# Patient Record
Sex: Female | Born: 1965 | Hispanic: No | Marital: Married | Smoking: Never smoker
Health system: Southern US, Community
[De-identification: ages and names within clinical notes are randomized; demographics above are authoritative.]

## PROBLEM LIST (undated history)

## (undated) DIAGNOSIS — E119 Type 2 diabetes mellitus without complications: Secondary | ICD-10-CM

## (undated) DIAGNOSIS — E785 Hyperlipidemia, unspecified: Secondary | ICD-10-CM

## (undated) HISTORY — PX: WISDOM TOOTH EXTRACTION: SHX21

---

## 2005-04-30 ENCOUNTER — Encounter: Admission: RE | Admit: 2005-04-30 | Discharge: 2005-04-30 | Payer: Self-pay | Admitting: Specialist

## 2005-08-28 ENCOUNTER — Other Ambulatory Visit: Admission: RE | Admit: 2005-08-28 | Discharge: 2005-08-28 | Payer: Self-pay | Admitting: Family Medicine

## 2005-10-15 ENCOUNTER — Ambulatory Visit (HOSPITAL_COMMUNITY): Admission: RE | Admit: 2005-10-15 | Discharge: 2005-10-15 | Payer: Self-pay | Admitting: Family Medicine

## 2006-07-17 ENCOUNTER — Other Ambulatory Visit: Admission: RE | Admit: 2006-07-17 | Discharge: 2006-07-17 | Payer: Self-pay | Admitting: Family Medicine

## 2007-07-30 ENCOUNTER — Other Ambulatory Visit: Admission: RE | Admit: 2007-07-30 | Discharge: 2007-07-30 | Payer: Self-pay | Admitting: Family Medicine

## 2008-05-07 ENCOUNTER — Ambulatory Visit (HOSPITAL_COMMUNITY): Admission: RE | Admit: 2008-05-07 | Discharge: 2008-05-07 | Payer: Self-pay | Admitting: Family Medicine

## 2008-05-21 ENCOUNTER — Encounter: Admission: RE | Admit: 2008-05-21 | Discharge: 2008-05-21 | Payer: Self-pay | Admitting: Family Medicine

## 2008-08-04 ENCOUNTER — Other Ambulatory Visit: Admission: RE | Admit: 2008-08-04 | Discharge: 2008-08-04 | Payer: Self-pay | Admitting: Family Medicine

## 2011-01-26 ENCOUNTER — Other Ambulatory Visit (HOSPITAL_COMMUNITY)
Admission: RE | Admit: 2011-01-26 | Discharge: 2011-01-26 | Disposition: A | Discharge status: Home / Self Care | Payer: 59 | Source: Ambulatory Visit | Attending: Family Medicine | Admitting: Family Medicine

## 2011-01-26 ENCOUNTER — Other Ambulatory Visit: Payer: Self-pay | Admitting: Family Medicine

## 2011-01-26 ENCOUNTER — Other Ambulatory Visit (HOSPITAL_COMMUNITY): Payer: Self-pay | Admitting: Family Medicine

## 2011-01-26 DIAGNOSIS — Z1231 Encounter for screening mammogram for malignant neoplasm of breast: Secondary | ICD-10-CM

## 2011-01-26 DIAGNOSIS — Z124 Encounter for screening for malignant neoplasm of cervix: Secondary | ICD-10-CM | POA: Insufficient documentation

## 2011-02-08 ENCOUNTER — Ambulatory Visit (HOSPITAL_COMMUNITY)
Admission: RE | Admit: 2011-02-08 | Discharge: 2011-02-08 | Disposition: A | Discharge status: Home / Self Care | Payer: 59 | Source: Ambulatory Visit | Attending: Family Medicine | Admitting: Family Medicine

## 2011-02-08 DIAGNOSIS — Z1231 Encounter for screening mammogram for malignant neoplasm of breast: Secondary | ICD-10-CM | POA: Insufficient documentation

## 2014-06-14 ENCOUNTER — Other Ambulatory Visit (HOSPITAL_COMMUNITY)
Admission: RE | Admit: 2014-06-14 | Discharge: 2014-06-14 | Disposition: A | Discharge status: Home / Self Care | Payer: 59 | Source: Ambulatory Visit | Attending: Family Medicine | Admitting: Family Medicine

## 2014-06-14 ENCOUNTER — Other Ambulatory Visit: Payer: Self-pay | Admitting: Family Medicine

## 2014-06-14 DIAGNOSIS — Z124 Encounter for screening for malignant neoplasm of cervix: Secondary | ICD-10-CM | POA: Diagnosis present

## 2014-06-14 DIAGNOSIS — Z1151 Encounter for screening for human papillomavirus (HPV): Secondary | ICD-10-CM | POA: Diagnosis present

## 2014-06-15 LAB — CYTOLOGY - PAP

## 2015-06-14 ENCOUNTER — Other Ambulatory Visit (HOSPITAL_COMMUNITY): Payer: Self-pay | Admitting: Family Medicine

## 2015-06-14 DIAGNOSIS — Z1231 Encounter for screening mammogram for malignant neoplasm of breast: Secondary | ICD-10-CM

## 2015-06-20 ENCOUNTER — Ambulatory Visit (HOSPITAL_COMMUNITY)
Admission: RE | Admit: 2015-06-20 | Discharge: 2015-06-20 | Disposition: A | Discharge status: Home / Self Care | Payer: 59 | Source: Ambulatory Visit | Attending: Family Medicine | Admitting: Family Medicine

## 2015-06-20 DIAGNOSIS — Z1231 Encounter for screening mammogram for malignant neoplasm of breast: Secondary | ICD-10-CM | POA: Diagnosis not present

## 2016-06-26 ENCOUNTER — Other Ambulatory Visit: Payer: Self-pay | Admitting: Family Medicine

## 2016-06-26 DIAGNOSIS — R1011 Right upper quadrant pain: Secondary | ICD-10-CM

## 2016-07-06 ENCOUNTER — Ambulatory Visit
Admission: RE | Admit: 2016-07-06 | Discharge: 2016-07-06 | Disposition: A | Discharge status: Home / Self Care | Payer: 59 | Source: Ambulatory Visit | Attending: Family Medicine | Admitting: Family Medicine

## 2016-07-06 DIAGNOSIS — R1011 Right upper quadrant pain: Secondary | ICD-10-CM

## 2016-07-25 ENCOUNTER — Ambulatory Visit: Payer: Self-pay | Admitting: Surgery

## 2016-07-30 NOTE — Pre-Procedure Instructions (Signed)
Emma [name]  07/30/2016     Your procedure is scheduled on : Thursday August 02, 2016 at 9:55 AM.  Report to [facility] Admitting at 7:50 AM.  Call this number if you have problems the morning of surgery: [phone]    Remember:  Do not eat food or drink liquids after midnight.  Take these medicines the morning of surgery with A SIP OF WATER : NONE  Stop taking any vitamins, herbal medications/supplements, NSAIDs, Ibuprofen, Advil, Motrin, Aleve, etc today   Do NOT take any diabetic pills the morning of your surgery (NO Metformin/Glucophage)    How to Manage Your Diabetes Before and After Surgery  Why is it important to control my blood sugar before and after surgery? . Improving blood sugar levels before and after surgery helps healing and can limit problems. . A way of improving blood sugar control is eating a healthy diet by: o  Eating less sugar and carbohydrates o  Increasing activity/exercise o  Talking with your doctor about reaching your blood sugar goals . High blood sugars (greater than 180 mg/dL) can raise your risk of infections and slow your recovery, so you will need to focus on controlling your diabetes during the weeks before surgery. . Make sure that the doctor who takes care of your diabetes knows about your planned surgery including the date and location.  How do I manage my blood sugar before surgery? . Check your blood sugar at least 4 times a day, starting 2 days before surgery, to make sure that the level is not too high or low. o Check your blood sugar the morning of your surgery when you wake up and every 2 hours until you get to the Short Stay unit. . If your blood sugar is less than 70 mg/dL, you will need to treat for low blood sugar: o Do not take insulin. o Treat a low blood sugar (less than 70 mg/dL) with  cup of clear juice (cranberry or apple), 4 glucose tablets, OR glucose gel. o Recheck blood sugar in 15 minutes after  treatment (to make sure it is greater than 70 mg/dL). If your blood sugar is not greater than 70 mg/dL on recheck, call [phone] for further instructions. . Report your blood sugar to the short stay nurse when you get to Short Stay.  . If you are admitted to the hospital after surgery: o Your blood sugar will be checked by the staff and you will probably be given insulin after surgery (instead of oral diabetes medicines) to make sure you have good blood sugar levels. o The goal for blood sugar control after surgery is 80-180 mg/dL.      WHAT DO I DO ABOUT MY DIABETES MEDICATION?   Marland Kitchen Do not take oral diabetes medicines (pills) the morning of surgery.  Reviewed and Endorsed by [facility] Patient Education Committee, August 2015   Do not wear jewelry, make-up or nail polish.  Do not wear lotions, powders, or perfumes, or deoderant.  Do not shave 48 hours prior to surgery.   Do not bring valuables to the hospital.  [facility] is not responsible for any belongings or valuables.  Contacts, dentures or bridgework may not be worn into surgery.  Leave your suitcase in the car.  After surgery it may be brought to your room.  For patients admitted to the hospital, discharge time will be determined by your treatment team.  Patients discharged the day of surgery will not be allowed  to drive home.   Name and phone number of your driver:    Special instructions:  Shower using CHG soap the night before and the morning of your surgery  Please read over the following information sheets that you were given.

## 2016-07-31 ENCOUNTER — Encounter (HOSPITAL_COMMUNITY)
Admission: RE | Admit: 2016-07-31 | Discharge: 2016-07-31 | Disposition: A | Discharge status: Home / Self Care | Payer: 59 | Source: Ambulatory Visit | Attending: Surgery | Admitting: Surgery

## 2016-07-31 ENCOUNTER — Encounter (HOSPITAL_COMMUNITY): Payer: Self-pay

## 2016-07-31 DIAGNOSIS — E119 Type 2 diabetes mellitus without complications: Secondary | ICD-10-CM | POA: Diagnosis not present

## 2016-07-31 DIAGNOSIS — E7800 Pure hypercholesterolemia, unspecified: Secondary | ICD-10-CM | POA: Diagnosis not present

## 2016-07-31 DIAGNOSIS — K219 Gastro-esophageal reflux disease without esophagitis: Secondary | ICD-10-CM | POA: Diagnosis not present

## 2016-07-31 DIAGNOSIS — Z79899 Other long term (current) drug therapy: Secondary | ICD-10-CM | POA: Diagnosis not present

## 2016-07-31 DIAGNOSIS — Z7984 Long term (current) use of oral hypoglycemic drugs: Secondary | ICD-10-CM | POA: Diagnosis not present

## 2016-07-31 DIAGNOSIS — K8010 Calculus of gallbladder with chronic cholecystitis without obstruction: Secondary | ICD-10-CM | POA: Diagnosis present

## 2016-07-31 HISTORY — DX: Hyperlipidemia, unspecified: E78.5

## 2016-07-31 HISTORY — DX: Type 2 diabetes mellitus without complications: E11.9

## 2016-07-31 LAB — CBC
HEMATOCRIT: 42.4 % (ref 36.0–46.0)
Hemoglobin: 14.1 g/dL (ref 12.0–15.0)
MCH: 29.4 pg (ref 26.0–34.0)
MCHC: 33.3 g/dL (ref 30.0–36.0)
MCV: 88.3 fL (ref 78.0–100.0)
Platelets: 184 10*3/uL (ref 150–400)
RBC: 4.8 MIL/uL (ref 3.87–5.11)
RDW: 12.5 % (ref 11.5–15.5)
WBC: 7.4 10*3/uL (ref 4.0–10.5)

## 2016-07-31 LAB — BASIC METABOLIC PANEL
ANION GAP: 8 (ref 5–15)
BUN: 5 mg/dL — ABNORMAL LOW (ref 6–20)
CO2: 24 mmol/L (ref 22–32)
Calcium: 9.2 mg/dL (ref 8.9–10.3)
Chloride: 104 mmol/L (ref 101–111)
Creatinine, Ser: 0.88 mg/dL (ref 0.44–1.00)
GFR calc Af Amer: >60 mL/min (ref >60)
GFR calc non Af Amer: >60 mL/min (ref >60)
GLUCOSE: 251 mg/dL — AB (ref 65–99)
POTASSIUM: 3.8 mmol/L (ref 3.5–5.1)
Sodium: 136 mmol/L (ref 135–145)

## 2016-07-31 LAB — GLUCOSE, CAPILLARY: Glucose-Capillary: 206 mg/dL — ABNORMAL HIGH (ref 65–99)

## 2016-07-31 LAB — HEMOGLOBIN A1C
Hgb A1c MFr Bld: 6.5 % — ABNORMAL HIGH (ref 4.8–5.6)
MEAN PLASMA GLUCOSE: 140 mg/dL

## 2016-07-31 LAB — HCG, SERUM, QUALITATIVE: Preg, Serum: NEGATIVE

## 2016-07-31 MED ORDER — CHLORHEXIDINE GLUCONATE CLOTH 2 % EX PADS: 6.0000 | MEDICATED_PAD | Freq: Once | CUTANEOUS | Status: DC

## 2016-07-31 NOTE — Progress Notes (Signed)
EKG noted. Patient stated she has not had another EKG in the past, therefore there will not be another available for comparison.   Will send chart to Anesthesia for review.

## 2016-07-31 NOTE — Progress Notes (Signed)
PCP is Emma [name]  Patient denied having any cardiac or pulmonary issues  Patient does not check blood glucose at home. CBG on arrival to PAT was 206. Patient stated she consumed "cereal and a banana, and drank some black coffee".

## 2016-08-01 ENCOUNTER — Encounter (HOSPITAL_COMMUNITY): Payer: Self-pay | Admitting: Surgery

## 2016-08-01 DIAGNOSIS — K8010 Calculus of gallbladder with chronic cholecystitis without obstruction: Secondary | ICD-10-CM | POA: Diagnosis present

## 2016-08-01 MED ORDER — CEFAZOLIN SODIUM-DEXTROSE 2-4 GM/100ML-% IV SOLN
2.0000 g | INTRAVENOUS | Status: AC
  Administered 2016-08-02: 2 g via INTRAVENOUS
  Filled 2016-08-01: qty 100

## 2016-08-01 NOTE — H&P (Signed)
General Surgery [facility]  Emma [name] DOB: Mar 09, 1966 Married / Language: English / Race: Undefined Female  History of Present Illness  Patient words: gallbladder.  The patient is a 50 year old female who presents for evaluation of gall stones.  Patient is referred by Dr. Shirlean Mylar for surgical management of symptomatic cholelithiasis and chronic cholecystitis. Patient presents with approximately a 2 month history of heartburn symptoms and right upper quadrant abdominal pain. Patient was placed on antacid medication. Ultrasound was performed on July 06, 2016. This shows a gallbladder filled with small stones. There is sludge present. There is a large stone measuring 3 cm in the neck of the gallbladder. There is no sign of infection. Patient has had no prior abdominal surgery. She denies fevers or chills. She has no prior history of a hepatic biliary or pancreatic disease. There is a family history of cholecystectomy in both a brother and a sister. Patient presents today to discuss cholecystectomy.  Other Problems Diabetes Mellitus Gastroesophageal Reflux Disease Hypercholesterolemia  Past Surgical History No pertinent past surgical history  Diagnostic Studies History Colonoscopy never  Allergies No Known Allergies09/13/2017  Medication History MetFORMIN HCl ER (500MG  Tablet ER 24HR, Oral) Active. Pravastatin Sodium (20MG  Tablet, Oral) Active. RaNITidine HCl (150MG  Tablet, Oral) Active.  Social History Alcohol use Occasional alcohol use. Caffeine use Coffee. No drug use Tobacco use Never smoker.  Family History Diabetes Mellitus Brother, Mother, Sister. Heart Disease Mother. Heart disease in female family member before age 49 Hypertension Mother.  Review of Systems General Not Present- Appetite Loss, Chills, Fatigue, Fever, Night Sweats, Weight Gain and Weight Loss. Skin Not Present- Change in Wart/Mole, Dryness,  Hives, Jaundice, New Lesions, Non-Healing Wounds, Rash and Ulcer. HEENT Present- Wears glasses/contact lenses. Not Present- Earache, Hearing Loss, Hoarseness, Nose Bleed, Oral Ulcers, Ringing in the Ears, Seasonal Allergies, Sinus Pain, Sore Throat, Visual Disturbances and Yellow Eyes. Respiratory Present- Snoring. Not Present- Bloody sputum, Chronic Cough, Difficulty Breathing and Wheezing. Breast Not Present- Breast Mass, Breast Pain, Nipple Discharge and Skin Changes. Cardiovascular Not Present- Chest Pain, Difficulty Breathing Lying Down, Leg Cramps, Palpitations, Rapid Heart Rate, Shortness of Breath and Swelling of Extremities. Gastrointestinal Not Present- Abdominal Pain, Bloating, Bloody Stool, Change in Bowel Habits, Chronic diarrhea, Constipation, Difficulty Swallowing, Excessive gas, Gets full quickly at meals, Hemorrhoids, Indigestion, Nausea, Rectal Pain and Vomiting. Musculoskeletal Not Present- Back Pain, Joint Pain, Joint Stiffness, Muscle Pain, Muscle Weakness and Swelling of Extremities. Neurological Not Present- Decreased Memory, Fainting, Headaches, Numbness, Seizures, Tingling, Tremor, Trouble walking and Weakness. Psychiatric Not Present- Anxiety, Bipolar, Change in Sleep Pattern, Depression, Fearful and Frequent crying. Endocrine Not Present- Cold Intolerance, Excessive Hunger, Hair Changes, Heat Intolerance, Hot flashes and New Diabetes. Hematology Not Present- Blood Thinners, Easy Bruising, Excessive bleeding, Gland problems, HIV and Persistent Infections.  Vitals Weight: 121.6 lb Height: 60.5in Body Surface Area: 1.52 m Body Mass Index: 23.36 kg/m  Temp.: 98.21F(Oral)  Pulse: 60 (Regular)  BP: 102/64 (Sitting, Left Arm, Standard)  Physical Exam The physical exam findings are as follows: Note:General - appears comfortable, no distress; not diaphorectic  HEENT - normocephalic; sclerae clear, gaze conjugate; mucous membranes moist, dentition good; voice  normal  Neck - symmetric on extension; no palpable anterior or posterior cervical adenopathy; no palpable masses in the thyroid bed  Chest - clear bilaterally without rhonchi, rales, or wheeze  Cor - regular rhythm with normal rate; no significant murmur  Abd - soft without distension; no hernias; no surgical incision; palpation  in the upper abdomen shows no tenderness and no mass  Ext - non-tender without significant edema or lymphedema  Neuro - grossly intact; no tremor  Assessment & Plan  CALCULUS OF GALLBLADDER WITH CHRONIC CHOLECYSTITIS WITHOUT OBSTRUCTION (K80.10)  Patient presents on referral for consideration for cholecystectomy. Patient is provided with written literature on gallbladder surgery to review at home.  Patient has significant cholelithiasis which is symptomatic. I have recommended proceeding with laparoscopic cholecystectomy with intraoperative cholangiography. We have discussed the risks and benefits of the procedure. We have discussed the hospital stay to be anticipated. We have discussed her postoperative recovery and restrictions on her diet. She understands and wishes to proceed with surgery in the near future.  The risks and benefits of the procedure have been discussed at length with the patient. The patient understands the proposed procedure, potential alternative treatments, and the course of recovery to be expected. All of the patient's questions have been answered at this time. The patient wishes to proceed with surgery.  Velora Hecklerodd M. Araeya Lamb, MD, [facility] Office: [phone]

## 2016-08-01 NOTE — Progress Notes (Signed)
Anesthesia Chart Review: Patient is a 50 year old female scheduled for laparoscopic cholecystectomy on 08/02/2016 by Dr. Gerrit FriendsGerkin.  History includes non-smoker, DM2, HLD, wisdom teeth extraction.  PCP is Dr. Shirlean Mylararol Webb.  Meds include metformin, pravastatin.  BP 111/68   Pulse (!) 108   Temp 36.7 C   Resp 20   Ht 5\' 1"  (1.549 m)   Wt 119 lb 6.4 oz (54.2 kg)   LMP 07/16/2016   SpO2 100%   BMI 22.56 kg/m   07/31/16 EKG: SR, low voltage QRS, non-specific T wave abnormality.  Preoperative labs noted. Non-fasting glucose 251, but A1c 6.5. CBC WNL. Cr 0.88. Negative serum pregnancy test.  If no acute changes then I anticipate that she can proceed as planned.  Velna Ochsllison Hayly Litsey, PA-C [facility] Short [facility]/Anesthesiology Phone [phone] 08/01/2016 9:31 AM

## 2016-08-02 ENCOUNTER — Ambulatory Visit (HOSPITAL_COMMUNITY): Payer: 59 | Admitting: Vascular Surgery

## 2016-08-02 ENCOUNTER — Encounter (HOSPITAL_COMMUNITY)
Admission: RE | Disposition: A | Discharge status: Home / Self Care | Payer: Self-pay | Source: Ambulatory Visit | Attending: Surgery

## 2016-08-02 ENCOUNTER — Observation Stay (HOSPITAL_COMMUNITY)
Admission: RE | Admit: 2016-08-02 | Discharge: 2016-08-02 | Disposition: A | Discharge status: Home / Self Care | Payer: 59 | Source: Ambulatory Visit | Attending: Surgery | Admitting: Surgery

## 2016-08-02 ENCOUNTER — Encounter (HOSPITAL_COMMUNITY): Payer: Self-pay | Admitting: Anesthesiology

## 2016-08-02 ENCOUNTER — Ambulatory Visit (HOSPITAL_COMMUNITY): Payer: 59 | Admitting: Anesthesiology

## 2016-08-02 DIAGNOSIS — K8010 Calculus of gallbladder with chronic cholecystitis without obstruction: Secondary | ICD-10-CM | POA: Diagnosis not present

## 2016-08-02 DIAGNOSIS — E119 Type 2 diabetes mellitus without complications: Secondary | ICD-10-CM | POA: Insufficient documentation

## 2016-08-02 DIAGNOSIS — K219 Gastro-esophageal reflux disease without esophagitis: Secondary | ICD-10-CM | POA: Insufficient documentation

## 2016-08-02 DIAGNOSIS — E7800 Pure hypercholesterolemia, unspecified: Secondary | ICD-10-CM | POA: Insufficient documentation

## 2016-08-02 DIAGNOSIS — Z7984 Long term (current) use of oral hypoglycemic drugs: Secondary | ICD-10-CM | POA: Insufficient documentation

## 2016-08-02 DIAGNOSIS — Z79899 Other long term (current) drug therapy: Secondary | ICD-10-CM | POA: Insufficient documentation

## 2016-08-02 HISTORY — PX: CHOLECYSTECTOMY: SHX55

## 2016-08-02 LAB — GLUCOSE, CAPILLARY
GLUCOSE-CAPILLARY: 142 mg/dL — AB (ref 65–99)
GLUCOSE-CAPILLARY: 156 mg/dL — AB (ref 65–99)

## 2016-08-02 SURGERY — LAPAROSCOPIC CHOLECYSTECTOMY WITH INTRAOPERATIVE CHOLANGIOGRAM
Anesthesia: General | Site: Abdomen

## 2016-08-02 MED ORDER — METFORMIN HCL ER 500 MG PO TB24: 500.0000 mg | ORAL_TABLET | Freq: Every day | ORAL | Status: DC

## 2016-08-02 MED ORDER — HYDROCODONE-ACETAMINOPHEN 5-325 MG PO TABS: 1.0000 | ORAL_TABLET | ORAL | 0 refills | Status: AC | PRN

## 2016-08-02 MED ORDER — PHENYLEPHRINE 40 MCG/ML (10ML) SYRINGE FOR IV PUSH (FOR BLOOD PRESSURE SUPPORT)
PREFILLED_SYRINGE | INTRAVENOUS | Status: AC
  Filled 2016-08-02: qty 10

## 2016-08-02 MED ORDER — MIDAZOLAM HCL 2 MG/2ML IJ SOLN
INTRAMUSCULAR | Status: AC
  Filled 2016-08-02: qty 2

## 2016-08-02 MED ORDER — HYDROCODONE-ACETAMINOPHEN 5-325 MG PO TABS
1.0000 | ORAL_TABLET | ORAL | Status: DC | PRN
Start: 2016-08-02 — End: 2016-08-02

## 2016-08-02 MED ORDER — HYDROMORPHONE HCL 1 MG/ML IJ SOLN
INTRAMUSCULAR | Status: AC
  Filled 2016-08-02: qty 1

## 2016-08-02 MED ORDER — PROPOFOL 10 MG/ML IV BOLUS
INTRAVENOUS | Status: DC | PRN
  Administered 2016-08-02: 160 mg via INTRAVENOUS

## 2016-08-02 MED ORDER — BUPIVACAINE-EPINEPHRINE 0.25% -1:200000 IJ SOLN
INTRAMUSCULAR | Status: DC | PRN
  Administered 2016-08-02: 30 mL

## 2016-08-02 MED ORDER — ONDANSETRON 4 MG PO TBDP: 4.0000 mg | ORAL_TABLET | Freq: Four times a day (QID) | ORAL | Status: DC | PRN

## 2016-08-02 MED ORDER — PROMETHAZINE HCL 25 MG/ML IJ SOLN: 6.2500 mg | INTRAMUSCULAR | Status: DC | PRN

## 2016-08-02 MED ORDER — ONDANSETRON HCL 4 MG/2ML IJ SOLN
INTRAMUSCULAR | Status: DC | PRN
  Administered 2016-08-02: 4 mg via INTRAVENOUS

## 2016-08-02 MED ORDER — LIDOCAINE 2% (20 MG/ML) 5 ML SYRINGE
INTRAMUSCULAR | Status: AC
  Filled 2016-08-02: qty 5

## 2016-08-02 MED ORDER — HYDROMORPHONE HCL 1 MG/ML IJ SOLN
0.2500 mg | INTRAMUSCULAR | Status: DC | PRN
  Administered 2016-08-02: 0.25 mg via INTRAVENOUS

## 2016-08-02 MED ORDER — IOPAMIDOL (ISOVUE-300) INJECTION 61%
INTRAVENOUS | Status: DC | PRN
  Administered 2016-08-02: 100 mL

## 2016-08-02 MED ORDER — LACTATED RINGERS IV SOLN
INTRAVENOUS | Status: DC
  Administered 2016-08-02 (×2): via INTRAVENOUS

## 2016-08-02 MED ORDER — BUPIVACAINE-EPINEPHRINE (PF) 0.25% -1:200000 IJ SOLN
INTRAMUSCULAR | Status: AC
  Filled 2016-08-02: qty 30

## 2016-08-02 MED ORDER — IOPAMIDOL (ISOVUE-300) INJECTION 61%
INTRAVENOUS | Status: AC
  Filled 2016-08-02: qty 50

## 2016-08-02 MED ORDER — FENTANYL CITRATE (PF) 100 MCG/2ML IJ SOLN: 25.0000 ug | INTRAMUSCULAR | Status: DC | PRN

## 2016-08-02 MED ORDER — ACETAMINOPHEN 650 MG RE SUPP: 650.0000 mg | Freq: Four times a day (QID) | RECTAL | Status: DC | PRN

## 2016-08-02 MED ORDER — ROCURONIUM BROMIDE 10 MG/ML (PF) SYRINGE
PREFILLED_SYRINGE | INTRAVENOUS | Status: AC
  Filled 2016-08-02: qty 10

## 2016-08-02 MED ORDER — ONDANSETRON HCL 4 MG/2ML IJ SOLN
INTRAMUSCULAR | Status: AC
  Filled 2016-08-02: qty 2

## 2016-08-02 MED ORDER — KCL IN DEXTROSE-NACL 20-5-0.45 MEQ/L-%-% IV SOLN: INTRAVENOUS | Status: DC

## 2016-08-02 MED ORDER — 0.9 % SODIUM CHLORIDE (POUR BTL) OPTIME
TOPICAL | Status: DC | PRN
  Administered 2016-08-02: 1000 mL

## 2016-08-02 MED ORDER — LIDOCAINE HCL (CARDIAC) 20 MG/ML IV SOLN
INTRAVENOUS | Status: DC | PRN
  Administered 2016-08-02: 80 mg via INTRAVENOUS

## 2016-08-02 MED ORDER — SODIUM CHLORIDE 0.9 % IR SOLN
Status: DC | PRN
  Administered 2016-08-02: 1000 mL

## 2016-08-02 MED ORDER — SUGAMMADEX SODIUM 200 MG/2ML IV SOLN
INTRAVENOUS | Status: DC | PRN
  Administered 2016-08-02: 200 mg via INTRAVENOUS

## 2016-08-02 MED ORDER — SUGAMMADEX SODIUM 200 MG/2ML IV SOLN
INTRAVENOUS | Status: AC
  Filled 2016-08-02: qty 2

## 2016-08-02 MED ORDER — FENTANYL CITRATE (PF) 100 MCG/2ML IJ SOLN
INTRAMUSCULAR | Status: DC | PRN
  Administered 2016-08-02: 100 ug via INTRAVENOUS

## 2016-08-02 MED ORDER — HYDROMORPHONE HCL 1 MG/ML IJ SOLN: 1.0000 mg | INTRAMUSCULAR | Status: DC | PRN

## 2016-08-02 MED ORDER — PRAVASTATIN SODIUM 10 MG PO TABS: 20.0000 mg | ORAL_TABLET | Freq: Every day | ORAL | Status: DC

## 2016-08-02 MED ORDER — FENTANYL CITRATE (PF) 100 MCG/2ML IJ SOLN
INTRAMUSCULAR | Status: AC
  Filled 2016-08-02: qty 4

## 2016-08-02 MED ORDER — MIDAZOLAM HCL 5 MG/5ML IJ SOLN
INTRAMUSCULAR | Status: DC | PRN
  Administered 2016-08-02: 2 mg via INTRAVENOUS

## 2016-08-02 MED ORDER — DEXAMETHASONE SODIUM PHOSPHATE 10 MG/ML IJ SOLN
INTRAMUSCULAR | Status: AC
  Filled 2016-08-02: qty 1

## 2016-08-02 MED ORDER — ONDANSETRON HCL 4 MG/2ML IJ SOLN: 4.0000 mg | Freq: Four times a day (QID) | INTRAMUSCULAR | Status: DC | PRN

## 2016-08-02 MED ORDER — PHENYLEPHRINE HCL 10 MG/ML IJ SOLN
INTRAMUSCULAR | Status: DC | PRN
  Administered 2016-08-02 (×4): 40 ug via INTRAVENOUS

## 2016-08-02 MED ORDER — DEXAMETHASONE SODIUM PHOSPHATE 4 MG/ML IJ SOLN
INTRAMUSCULAR | Status: DC | PRN
  Administered 2016-08-02: 4 mg via INTRAVENOUS

## 2016-08-02 MED ORDER — PROPOFOL 10 MG/ML IV BOLUS
INTRAVENOUS | Status: AC
  Filled 2016-08-02: qty 20

## 2016-08-02 MED ORDER — ACETAMINOPHEN 325 MG PO TABS: 650.0000 mg | ORAL_TABLET | Freq: Four times a day (QID) | ORAL | Status: DC | PRN

## 2016-08-02 MED ORDER — ROCURONIUM BROMIDE 100 MG/10ML IV SOLN
INTRAVENOUS | Status: DC | PRN
  Administered 2016-08-02: 40 mg via INTRAVENOUS

## 2016-08-02 SURGICAL SUPPLY — 52 items
APPLIER CLIP ROT 10 11.4 M/L (STAPLE) ×3
BENZOIN TINCTURE PRP APPL 2/3 (GAUZE/BANDAGES/DRESSINGS) ×3 IMPLANT
BLADE SURG CLIPPER 3M 9600 (MISCELLANEOUS) IMPLANT
CANISTER SUCTION 2500CC (MISCELLANEOUS) ×3 IMPLANT
CHLORAPREP W/TINT 26ML (MISCELLANEOUS) ×3 IMPLANT
CLIP APPLIE ROT 10 11.4 M/L (STAPLE) ×1 IMPLANT
CLOSURE WOUND 1/2 X4 (GAUZE/BANDAGES/DRESSINGS) ×1
COVER MAYO STAND STRL (DRAPES) ×3 IMPLANT
COVER SURGICAL LIGHT HANDLE (MISCELLANEOUS) ×3 IMPLANT
DRAPE C-ARM 42X72 X-RAY (DRAPES) ×3 IMPLANT
DRAPE UTILITY XL STRL (DRAPES) ×3 IMPLANT
ELECT REM PT RETURN 9FT ADLT (ELECTROSURGICAL) ×3
ELECTRODE REM PT RTRN 9FT ADLT (ELECTROSURGICAL) ×1 IMPLANT
GAUZE SPONGE 2X2 8PLY STRL LF (GAUZE/BANDAGES/DRESSINGS) ×1 IMPLANT
GAUZE SPONGE 4X4 16PLY XRAY LF (GAUZE/BANDAGES/DRESSINGS) ×3 IMPLANT
GLOVE BIO SURGEON STRL SZ7.5 (GLOVE) ×3 IMPLANT
GLOVE BIOGEL PI IND STRL 6.5 (GLOVE) ×1 IMPLANT
GLOVE BIOGEL PI IND STRL 7.5 (GLOVE) ×2 IMPLANT
GLOVE BIOGEL PI IND STRL 8 (GLOVE) ×1 IMPLANT
GLOVE BIOGEL PI INDICATOR 6.5 (GLOVE) ×2
GLOVE BIOGEL PI INDICATOR 7.5 (GLOVE) ×4
GLOVE BIOGEL PI INDICATOR 8 (GLOVE) ×2
GLOVE ECLIPSE 7.5 STRL STRAW (GLOVE) ×3 IMPLANT
GLOVE SURG ORTHO 8.0 STRL STRW (GLOVE) ×3 IMPLANT
GLOVE SURG SS PI 6.5 STRL IVOR (GLOVE) ×3 IMPLANT
GOWN STRL REUS W/ TWL LRG LVL3 (GOWN DISPOSABLE) ×3 IMPLANT
GOWN STRL REUS W/ TWL XL LVL3 (GOWN DISPOSABLE) ×1 IMPLANT
GOWN STRL REUS W/TWL LRG LVL3 (GOWN DISPOSABLE) ×6
GOWN STRL REUS W/TWL XL LVL3 (GOWN DISPOSABLE) ×2
KIT BASIN OR (CUSTOM PROCEDURE TRAY) ×3 IMPLANT
KIT ROOM TURNOVER OR (KITS) ×3 IMPLANT
NS IRRIG 1000ML POUR BTL (IV SOLUTION) ×3 IMPLANT
PAD ARMBOARD 7.5X6 YLW CONV (MISCELLANEOUS) ×3 IMPLANT
POUCH SPECIMEN RETRIEVAL 10MM (ENDOMECHANICALS) ×3 IMPLANT
SCISSORS LAP 5X35 DISP (ENDOMECHANICALS) ×3 IMPLANT
SET CHOLANGIOGRAPH 5 50 .035 (SET/KITS/TRAYS/PACK) ×3 IMPLANT
SET IRRIG TUBING LAPAROSCOPIC (IRRIGATION / IRRIGATOR) ×3 IMPLANT
SLEEVE ENDOPATH XCEL 5M (ENDOMECHANICALS) ×3 IMPLANT
SPECIMEN JAR SMALL (MISCELLANEOUS) ×3 IMPLANT
SPONGE GAUZE 2X2 STER 10/PKG (GAUZE/BANDAGES/DRESSINGS) ×2
STRIP CLOSURE SKIN 1/2X4 (GAUZE/BANDAGES/DRESSINGS) ×2 IMPLANT
SUT MNCRL AB 4-0 PS2 18 (SUTURE) ×3 IMPLANT
SYR CONTROL 10ML LL (SYRINGE) ×3 IMPLANT
SYRINGE 20CC LL (MISCELLANEOUS) ×6 IMPLANT
TAPE CLOTH SURG 4X10 WHT LF (GAUZE/BANDAGES/DRESSINGS) ×3 IMPLANT
TOWEL OR 17X24 6PK STRL BLUE (TOWEL DISPOSABLE) ×3 IMPLANT
TOWEL OR 17X26 10 PK STRL BLUE (TOWEL DISPOSABLE) IMPLANT
TRAY LAPAROSCOPIC MC (CUSTOM PROCEDURE TRAY) ×3 IMPLANT
TROCAR XCEL BLUNT TIP 100MML (ENDOMECHANICALS) ×3 IMPLANT
TROCAR XCEL NON-BLD 11X100MML (ENDOMECHANICALS) ×3 IMPLANT
TROCAR XCEL NON-BLD 5MMX100MML (ENDOMECHANICALS) ×3 IMPLANT
TUBING INSUFFLATION (TUBING) ×3 IMPLANT

## 2016-08-02 NOTE — Discharge Instructions (Signed)
°  [facility] ° °LAPAROSCOPIC SURGERY:  POST-OP INSTRUCTIONS ° °Always review your discharge instruction sheet given to you by the facility where your surgery was performed. ° °A prescription for pain medication may be given to you upon discharge.  Take your pain medication as prescribed.  If narcotic pain medicine is not needed, then you may take acetaminophen (Tylenol) or ibuprofen (Advil) as needed. ° °Take your usually prescribed medications unless otherwise directed. ° °If you need a refill on your pain medication, please contact your pharmacy.  They will contact our office to request authorization. Prescriptions will not be filled after 5 P.M. or on weekends. ° °You should follow a light diet the first few days after arrival home, such as soup and crackers or toast.  Be sure to include plenty of fluids daily. ° °Most patients will experience some swelling and bruising in the area of the incisions.  Ice packs will help.  Swelling and bruising can take several days to resolve.  ° °It is common to experience some constipation after surgery.  Increasing fluid intake and taking a stool softener (such as Colace) will usually help or prevent this problem from occurring.  A mild laxative (Milk of Magnesia or Miralax) should be taken according to package instructions if there has been no bowel movement after 48 hours. ° °You will have steri-strips and a gauze dressing over your incisions.  You may remove the gauze bandage on the second day after surgery, and you may shower at that time.  Leave your steri-strips (small skin tapes) in place directly over the incision.  These strips should remain on the skin for 5-7 days and then be removed.  You may get them wet in the shower and pat them dry. ° °Any sutures or staples will be removed at the office during your follow-up visit. ° °ACTIVITIES:  You may resume regular (light) daily activities beginning the next day - such as daily self-care, walking,  climbing stairs - gradually increasing activities as tolerated.  You may have sexual intercourse when it is comfortable.  Refrain from any heavy lifting or straining until approved by your doctor. ° °You may drive when you are no longer taking prescription pain medication, you can comfortably wear a seatbelt, and you can safely maneuver your car and apply brakes. ° °You should see your doctor in the office for a follow-up appointment approximately 2-3 weeks after your surgery.  Make sure that you call for this appointment within a day or two after you arrive home to insure a convenient appointment time. ° °WHEN TO CALL YOUR DOCTOR: °1. Fever over 101.0 °2. Inability to urinate °3. Continued bleeding from incision °4. Increased pain, redness, or drainage from the incision °5. Increasing abdominal pain ° °The clinic staff is available to answer your questions during regular business hours.  Please don’t hesitate to call and ask to speak to one of the nurses for clinical concerns.  If you have a medical emergency, go to the nearest emergency room or call 911.  A surgeon from [facility] is always on call for the hospital. ° °Marijane Trower M. Herbert Marken, MD, FACS °[facility] °Office: [phone] °Toll Free:  [phone] °FAX [phone] ° °Website: [email or web address] °

## 2016-08-02 NOTE — Anesthesia Postprocedure Evaluation (Signed)
Anesthesia Post Note  Patient: Emma [name]  Procedure(s) Performed: Procedure(s) (LRB): LAPAROSCOPIC CHOLECYSTECTOMY WITH ATTEMPTED CHOLANGIOGRAM (N/A)  Patient location during evaluation: PACU Anesthesia Type: General Level of consciousness: awake Pain management: pain level controlled Vital Signs Assessment: post-procedure vital signs reviewed and stable Respiratory status: spontaneous breathing Cardiovascular status: stable Anesthetic complications: no    Last Vitals:  Vitals:   08/02/16 0805 08/02/16 1057  BP: 102/69 114/73  Pulse: 79   Resp: 20   Temp: 36.6 C 36.4 C    Last Pain:  Vitals:   08/02/16 0805  TempSrc: Oral                 EDWARDS,Azure Barrales

## 2016-08-02 NOTE — Transfer of Care (Signed)
Immediate Anesthesia Transfer of Care Note  Patient: Emma [name]  Procedure(s) Performed: Procedure(s): LAPAROSCOPIC CHOLECYSTECTOMY WITH ATTEMPTED CHOLANGIOGRAM (N/A)  Patient Location: PACU  Anesthesia Type:General  Level of Consciousness: awake, oriented and patient cooperative  Airway & Oxygen Therapy: Patient Spontanous Breathing and Patient connected to face mask oxygen  Post-op Assessment: Report given to RN and Post -op Vital signs reviewed and stable  Post vital signs: Reviewed  Last Vitals:  Vitals:   08/02/16 0805  BP: 102/69  Pulse: 79  Resp: 20  Temp: 36.6 C    Last Pain:  Vitals:   08/02/16 0805  TempSrc: Oral      Patients Stated Pain Goal: 4 (08/02/16 0810)  Complications: No apparent anesthesia complications

## 2016-08-02 NOTE — Progress Notes (Signed)
Pt discharged home explained prescription meds and home instructions and next appt, no IV line, alert and oriented escorted by the family and accompanied by Nurse tech via wheelchair, no s/s of distress noted, all personal belongings given.

## 2016-08-02 NOTE — Op Note (Signed)
Procedure Note  Pre-operative Diagnosis:  Chronic cholecystitis, cholelithiasis  Post-operative Diagnosis:  same  Surgeon:  Velora Hecklerodd M. Jamara Vary, MD, FACS  Assistant:  Magnus IvanSheila Bowman, RNFA   Procedure:  Laparoscopic cholecystectomy  Anesthesia:  General  Estimated Blood Loss:  minimal  Drains: none         Specimen: Gallbladder to pathology  Indications:  The patient is a 50 year old female who presents for evaluation of gall stones. Patient is referred by Dr. Shirlean Mylararol Webb for surgical management of symptomatic cholelithiasis and chronic cholecystitis. Patient presents with approximately a 2 month history of heartburn symptoms and right upper quadrant abdominal pain. Patient was placed on antacid medication. Ultrasound was performed on July 06, 2016. This shows a gallbladder filled with small stones. There is sludge present. There is a large stone measuring 3 cm in the neck of the gallbladder. Patient now comes to surgery for cholecystectomy.  Procedure Details:  The patient was seen in the pre-op holding area. The risks, benefits, complications, treatment options, and expected outcomes were previously discussed with the patient. The patient agreed with the proposed plan and has signed the informed consent form.  The patient was brought to the Operating Room, identified as Ronnette HilaUma Ritchie and the procedure verified as laparoscopic cholecystectomy with intraoperative cholangiography. A "time out" was completed and the above information confirmed.  The patient was placed in the supine position. Following induction of general anesthesia, the abdomen was prepped and draped in the usual aseptic fashion.  An incision was made in the skin near the umbilicus. The midline fascia was incised and the peritoneal cavity was entered and a Hasson canula was introduced under direct vision.  The Hasson canula was secured with a 0-Vicryl pursestring suture. Pneumoperitoneum was established with carbon  dioxide. Additional trocars were introduced under direct vision along the right costal margin in the midline, mid-clavicular line, and anterior axillary line.   The gallbladder was identified and the fundus grasped and retracted cephalad. Adhesions were taken down bluntly and the electrocautery was utilized as needed, taking care not to injure any adjacent structures. The infundibulum was grasped and retracted laterally, exposing the peritoneum overlying the triangle of Calot. The peritoneum was incised and structures exposed with blunt dissection. The cystic duct was clearly identified, bluntly dissected circumferentially, and clipped at the neck of the gallbladder.  An incision was made in the cystic duct and an attepmpt to place a cholangiogram catheter into  the cystic duct was made.  Catheter was unable to be advanced and secured, and a decision not to perform cholangiogram was made.  The cystic duct was ligated with surgical clips and divided. The cystic artery was identified, dissected circumferentially, ligated with ligaclips, and divided.  The gallbladder was dissected away from the liver bed using the electrocautery for hemostasis. The gallbladder was completely removed from the liver and placed into an endocatch bag. The gallbladder was removed in the endocatch bag through the umbilical port site and submitted to pathology for review.  The right upper quadrant was irrigated and the gallbladder bed was inspected. Hemostasis was achieved with the electrocautery.  Pneumoperitoneum was released after viewing removal of the trocars with good hemostasis noted. The umbilical wound was irrigated and the fascia was then closed with the pursestring suture.  Local anesthetic was infiltrated at all port sites. The skin incisions were closed with 4-0 Monocril subcuticular sutures and steri-strips and dressings were applied.  Instrument, sponge, and needle counts were correct at the conclusion of the  case.   The patient was awakened from anesthesia and brought to the recovery room in stable condition.  The patient tolerated the procedure well.   Velora Heckler, MD, [facility] Office: [phone]

## 2016-08-02 NOTE — Anesthesia Preprocedure Evaluation (Addendum)
Anesthesia Evaluation  Patient identified by MRN, date of birth, ID band Patient awake    Reviewed: Allergy & Precautions, H&P , NPO status , Patient's Chart, lab work & pertinent test results  History of Anesthesia Complications Negative for: history of anesthetic complications  Airway Mallampati: II  TM Distance: >3 FB Neck ROM: full    Dental no notable dental hx. (+) Dental Advisory Given, Teeth Intact   Pulmonary neg pulmonary ROS,    Pulmonary exam normal breath sounds clear to auscultation       Cardiovascular negative cardio ROS Normal cardiovascular exam Rhythm:regular Rate:Normal     Neuro/Psych negative neurological ROS     GI/Hepatic negative GI ROS, Neg liver ROS,   Endo/Other  diabetes, Well Controlled, Type 2, Oral Hypoglycemic Agents  Renal/GU negative Renal ROS     Musculoskeletal   Abdominal   Peds  Hematology negative hematology ROS (+)   Anesthesia Other Findings   Reproductive/Obstetrics negative OB ROS                            Anesthesia Physical Anesthesia Plan  ASA: II  Anesthesia Plan: General   Post-op Pain Management:    Induction: Intravenous  Airway Management Planned: Oral ETT  Additional Equipment: None  Intra-op Plan:   Post-operative Plan: Extubation in OR  Informed Consent: I have reviewed the patients History and Physical, chart, labs and discussed the procedure including the risks, benefits and alternatives for the proposed anesthesia with the patient or authorized representative who has indicated his/her understanding and acceptance.   Dental Advisory Given  Plan Discussed with: Anesthesiologist, CRNA and Surgeon  Anesthesia Plan Comments:         Anesthesia Quick Evaluation

## 2016-08-02 NOTE — Anesthesia Procedure Notes (Addendum)
Procedure Name: Intubation Date/Time: 08/02/2016 9:40 AM Performed by: Lovie CholOCK, Jnaya Butrick K Pre-anesthesia Checklist: Patient identified, Emergency Drugs available, Suction available and Patient being monitored Patient Re-evaluated:Patient Re-evaluated prior to inductionOxygen Delivery Method: Circle System Utilized Preoxygenation: Pre-oxygenation with 100% oxygen Intubation Type: IV induction Ventilation: Mask ventilation without difficulty Laryngoscope Size: Miller and 2 Grade View: Grade I Tube type: Oral Tube size: 7.0 mm Number of attempts: 1 Airway Equipment and Method: Stylet and Oral airway Placement Confirmation: ETT inserted through vocal cords under direct vision,  positive ETCO2 and breath sounds checked- equal and bilateral Secured at: 20 cm Tube secured with: Tape Dental Injury: Teeth and Oropharynx as per pre-operative assessment

## 2016-08-02 NOTE — Discharge Summary (Signed)
  Physician Discharge Summary [facility]  Patient ID: Emma [name] MRN: [id number] DOB/AGE: [birth year] 50 y.o.  Admit date: 08/02/2016 Discharge date: 08/02/2016  Admission Diagnoses:  Chronic cholecystitis, cholelithiasis   Discharge Diagnoses:  Principal Problem:   Cholelithiasis with chronic cholecystitis   Discharged Condition: good  Hospital Course: Patient was admitted for observation following gallbladder surgery.  Post op course was uncomplicated.  Pain was well controlled.  Tolerated diet.  Prepared for discharge late afternoon following surgery.  Consults: None  Treatments: surgery: lap chole  Discharge Exam: Blood pressure 108/69, pulse (!) 112, temperature 98.3 F (36.8 C), temperature source Oral, resp. rate 18, weight 54 kg (119 lb), last menstrual period 07/16/2016, SpO2 100 %. N/A   Disposition: Home   Med Rec must be completed prior to using this [facility]    Follow-up Information    Emma Krupka M, MD. Schedule an appointment as soon as possible for a visit in 3 weeks.   Specialty:  General Surgery Why:  For wound re-check Contact information: [address] [place] [address] [phone]           Velora Hecklerodd [name]. Navneet Schmuck, MD, [facility] Office: [phone]   Signed: Velora HecklerGERKIN,[name] 08/02/2016, 4:36 PM

## 2016-08-02 NOTE — Interval H&P Note (Signed)
History and Physical Interval Note:  08/02/2016 9:01 AM  Emma [name]  has presented today for surgery, with the diagnosis of chronic cholecystitis with cholelithiasis.  The various methods of treatment have been discussed with the patient. After consideration of risks, benefits and other options for treatment, the patient has consented to    Procedure(s): LAPAROSCOPIC CHOLECYSTECTOMY WITH INTRAOPERATIVE CHOLANGIOGRAM (N/A) as a surgical intervention .    The patient's history has been reviewed, patient examined, no change in status, stable for surgery.  I have reviewed the patient's chart and labs.  Questions were answered to the patient's satisfaction.    Velora Hecklerodd M. Gailen Venne, MD, [facility] Office: [phone]   Emma [name]

## 2016-08-03 ENCOUNTER — Encounter (HOSPITAL_COMMUNITY): Payer: Self-pay | Admitting: Surgery

## 2018-10-13 ENCOUNTER — Other Ambulatory Visit: Payer: Self-pay | Admitting: Family Medicine

## 2018-10-13 DIAGNOSIS — Z1231 Encounter for screening mammogram for malignant neoplasm of breast: Secondary | ICD-10-CM

## 2018-10-21 ENCOUNTER — Ambulatory Visit
Admission: RE | Admit: 2018-10-21 | Discharge: 2018-10-21 | Disposition: A | Discharge status: Home / Self Care | Payer: 59 | Source: Ambulatory Visit | Attending: Family Medicine | Admitting: Family Medicine

## 2018-10-21 DIAGNOSIS — Z1231 Encounter for screening mammogram for malignant neoplasm of breast: Secondary | ICD-10-CM

## 2018-10-31 ENCOUNTER — Other Ambulatory Visit (HOSPITAL_COMMUNITY)
Admission: RE | Admit: 2018-10-31 | Discharge: 2018-10-31 | Disposition: A | Discharge status: Home / Self Care | Payer: 59 | Source: Ambulatory Visit | Attending: Family Medicine | Admitting: Family Medicine

## 2018-10-31 ENCOUNTER — Other Ambulatory Visit: Payer: Self-pay | Admitting: Family Medicine

## 2018-10-31 DIAGNOSIS — Z01411 Encounter for gynecological examination (general) (routine) with abnormal findings: Secondary | ICD-10-CM | POA: Insufficient documentation

## 2018-11-06 LAB — CYTOLOGY - PAP
Diagnosis: NEGATIVE
HPV (WINDOPATH): NOT DETECTED

## 2020-01-10 IMAGING — MG DIGITAL SCREENING BILATERAL MAMMOGRAM WITH TOMO AND CAD
6 series · 6 of 18 positions shown · non-contrast
Comparison: Previous exam(s).

CLINICAL DATA: Screening.

EXAM:
DIGITAL SCREENING BILATERAL MAMMOGRAM WITH TOMO AND CAD

[R MLO synth-2D]
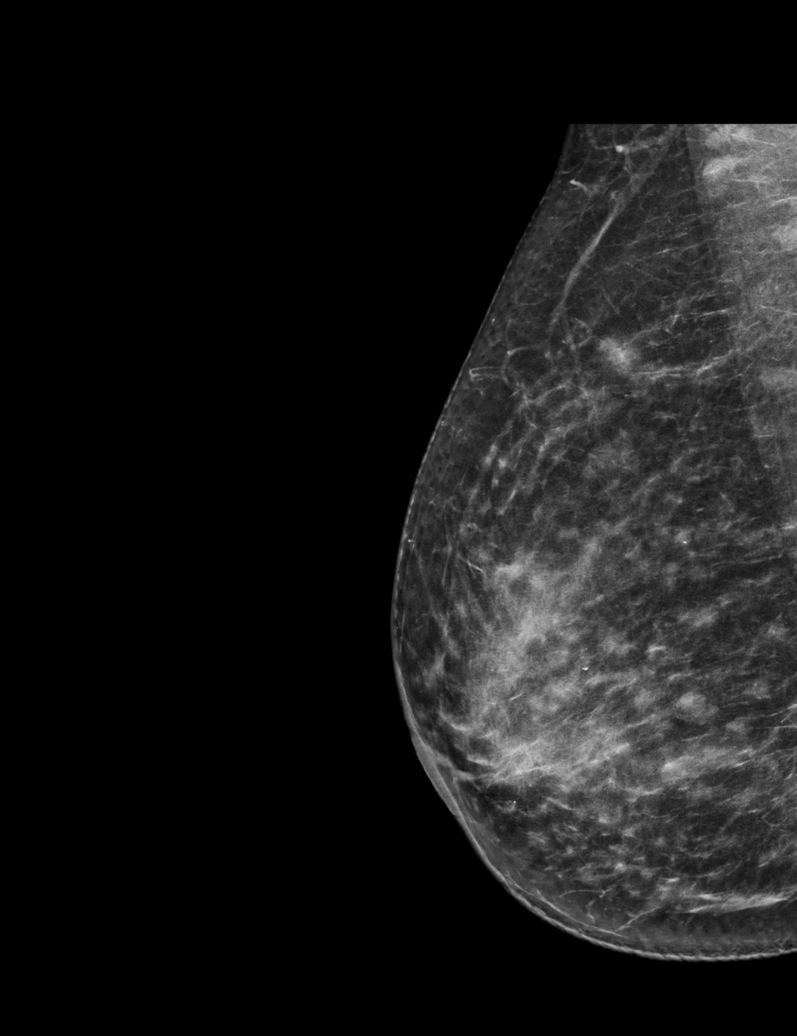

[L CC synth-2D]
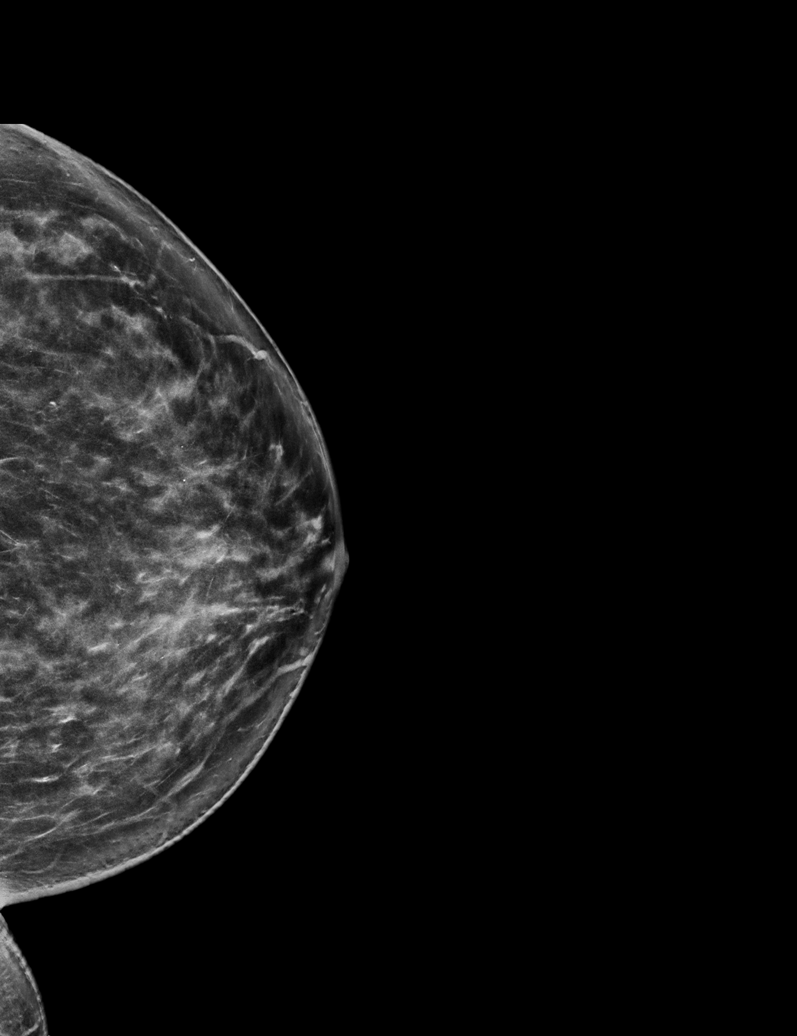

[L MLO synth-2D]
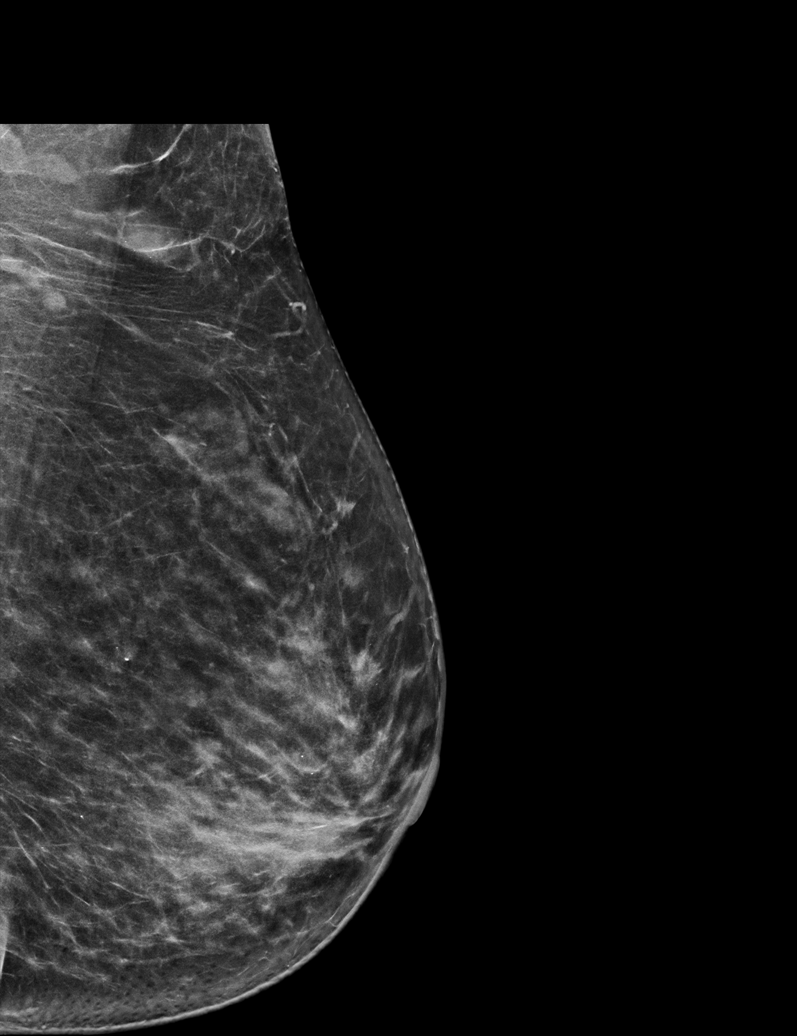

[R MLO tomo · tomo slice 33/65.0]
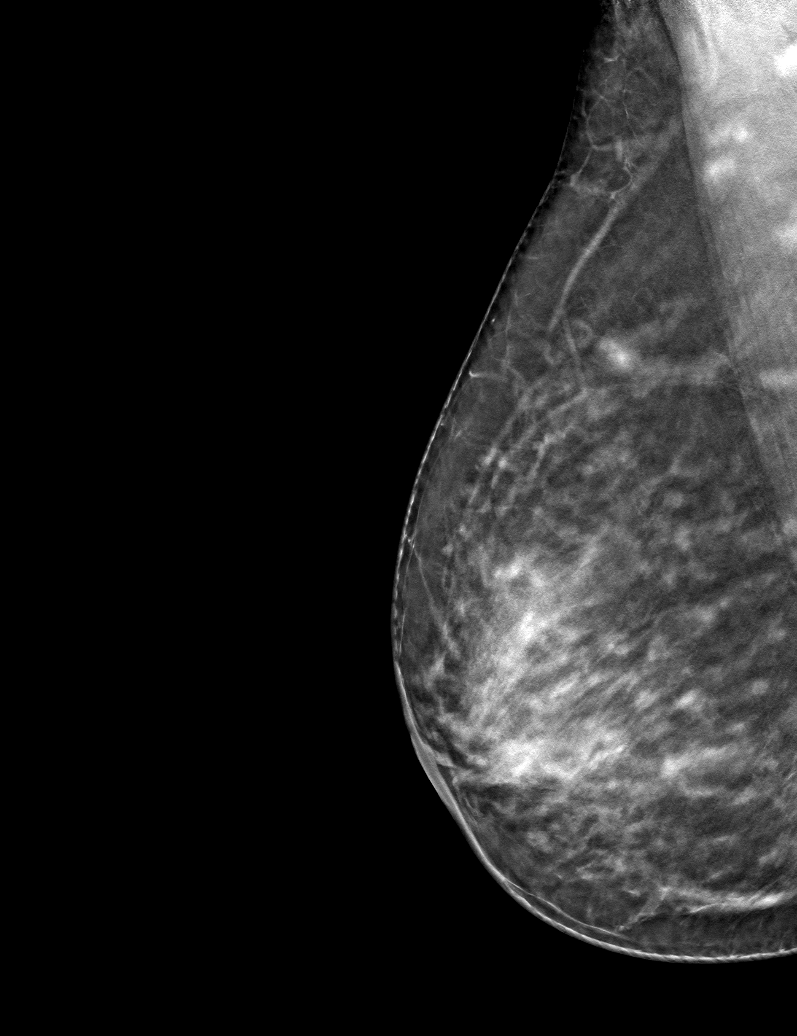

[L CC tomo · tomo slice 33/65.0]
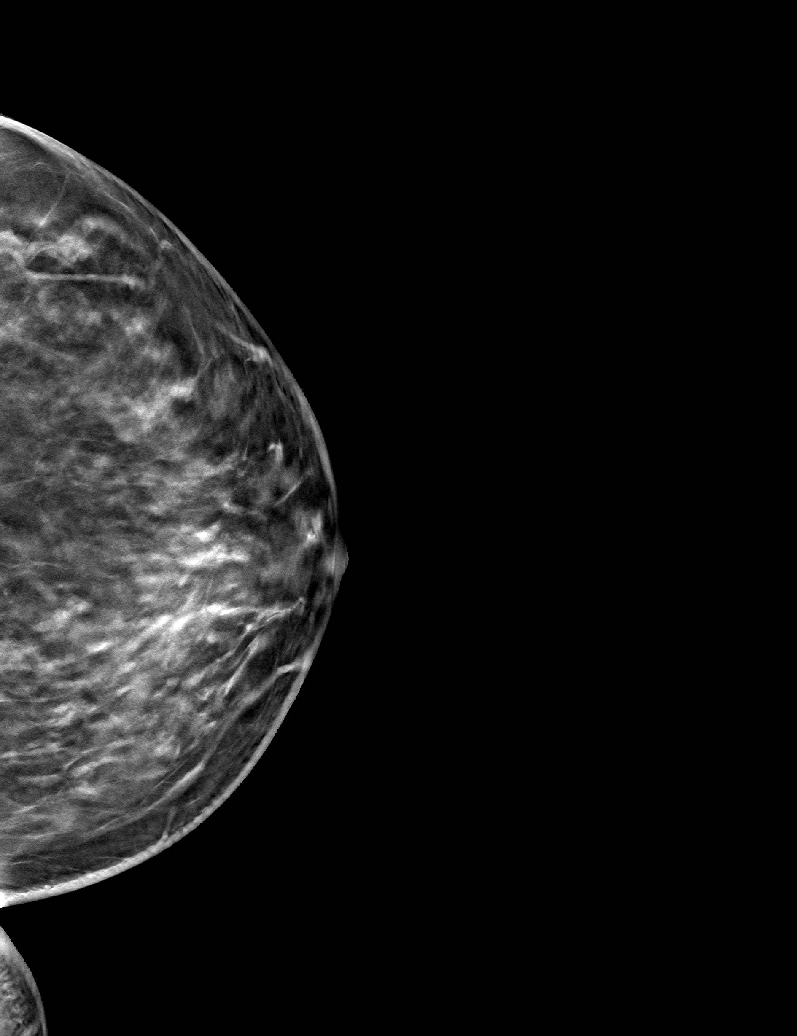

[R CC tomo · tomo slice 33/64.0]
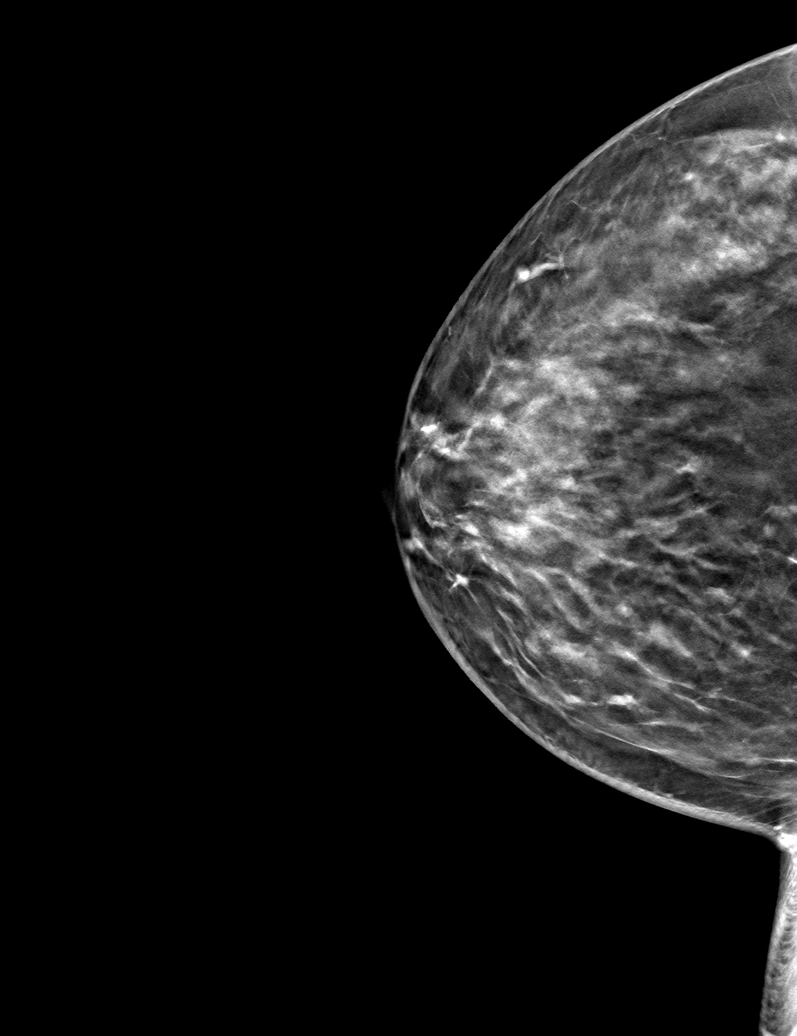

[6 of 18 positions shown; findings below may reference images not displayed]

ACR Breast Density Category c: The breast tissue is heterogeneously
dense, which may obscure small masses.
FINDINGS: There are no findings suspicious for malignancy. Images were
processed with CAD.
IMPRESSION: No mammographic evidence of malignancy. A result letter of this
screening mammogram will be mailed directly to the patient.

RECOMMENDATION:
Screening mammogram in one year. (Code:[id number])

BI-RADS CATEGORY  1: Negative.
